# Patient Record
Sex: Female | Born: 1965
Health system: Southern US, Community
[De-identification: ages and names within clinical notes are randomized; demographics above are authoritative.]

## PROBLEM LIST (undated history)

## (undated) HISTORY — PX: DILATION AND CURETTAGE OF UTERUS: SHX78

---

## 2018-09-21 ENCOUNTER — Emergency Department (HOSPITAL_COMMUNITY)

## 2018-09-21 ENCOUNTER — Emergency Department (HOSPITAL_COMMUNITY)
Admission: EM | Admit: 2018-09-21 | Discharge: 2018-09-21 | Disposition: A | Discharge status: Home / Self Care | Attending: Emergency Medicine | Admitting: Emergency Medicine

## 2018-09-21 ENCOUNTER — Encounter (HOSPITAL_COMMUNITY): Payer: Self-pay

## 2018-09-21 ENCOUNTER — Other Ambulatory Visit: Payer: Self-pay

## 2018-09-21 DIAGNOSIS — R109 Unspecified abdominal pain: Secondary | ICD-10-CM | POA: Diagnosis present

## 2018-09-21 DIAGNOSIS — N200 Calculus of kidney: Secondary | ICD-10-CM | POA: Diagnosis not present

## 2018-09-21 DIAGNOSIS — R110 Nausea: Secondary | ICD-10-CM | POA: Diagnosis not present

## 2018-09-21 DIAGNOSIS — K5900 Constipation, unspecified: Secondary | ICD-10-CM | POA: Diagnosis not present

## 2018-09-21 DIAGNOSIS — Z79899 Other long term (current) drug therapy: Secondary | ICD-10-CM | POA: Insufficient documentation

## 2018-09-21 LAB — I-STAT BETA HCG BLOOD, ED (MC, WL, AP ONLY): I-stat hCG, quantitative: <5 m[IU]/mL (ref <5)

## 2018-09-21 LAB — URINALYSIS, ROUTINE W REFLEX MICROSCOPIC
Bilirubin Urine: NEGATIVE
Glucose, UA: NEGATIVE mg/dL
Hgb urine dipstick: NEGATIVE
Ketones, ur: NEGATIVE mg/dL
Leukocytes,Ua: NEGATIVE
Nitrite: NEGATIVE
Protein, ur: NEGATIVE mg/dL
Specific Gravity, Urine: 1.015 (ref 1.005–1.030)
pH: 6 (ref 5.0–8.0)

## 2018-09-21 LAB — CBC
HCT: 43.6 % (ref 36.0–46.0)
Hemoglobin: 13.8 g/dL (ref 12.0–15.0)
MCH: 30.2 pg (ref 26.0–34.0)
MCHC: 31.7 g/dL (ref 30.0–36.0)
MCV: 95.4 fL (ref 80.0–100.0)
Platelets: 146 10*3/uL — ABNORMAL LOW (ref 150–400)
RBC: 4.57 MIL/uL (ref 3.87–5.11)
RDW: 13.2 % (ref 11.5–15.5)
WBC: 18.5 10*3/uL — ABNORMAL HIGH (ref 4.0–10.5)
nRBC: 0 % (ref 0.0–0.2)

## 2018-09-21 LAB — BASIC METABOLIC PANEL
Anion gap: 10 (ref 5–15)
BUN: 13 mg/dL (ref 6–20)
CO2: 23 mmol/L (ref 22–32)
Calcium: 9.3 mg/dL (ref 8.9–10.3)
Chloride: 108 mmol/L (ref 98–111)
Creatinine, Ser: 0.95 mg/dL (ref 0.44–1.00)
GFR calc Af Amer: >60 mL/min (ref >60)
GFR calc non Af Amer: >60 mL/min (ref >60)
Glucose, Bld: 138 mg/dL — ABNORMAL HIGH (ref 70–99)
Potassium: 3.8 mmol/L (ref 3.5–5.1)
Sodium: 141 mmol/L (ref 135–145)

## 2018-09-21 MED ORDER — ONDANSETRON HCL 4 MG/2ML IJ SOLN
4.0000 mg | Freq: Once | INTRAMUSCULAR | Status: AC
  Administered 2018-09-21: 4 mg via INTRAVENOUS
  Filled 2018-09-21: qty 2

## 2018-09-21 MED ORDER — ONDANSETRON 4 MG PO TBDP
8.0000 mg | ORAL_TABLET | Freq: Once | ORAL | Status: AC
  Administered 2018-09-21: 8 mg via ORAL
  Filled 2018-09-21: qty 2

## 2018-09-21 MED ORDER — SODIUM CHLORIDE 0.9 % IV BOLUS
1000.0000 mL | Freq: Once | INTRAVENOUS | Status: AC
  Administered 2018-09-21: 1000 mL via INTRAVENOUS

## 2018-09-21 MED ORDER — KETOROLAC TROMETHAMINE 15 MG/ML IJ SOLN
15.0000 mg | Freq: Once | INTRAMUSCULAR | Status: AC
  Administered 2018-09-21: 15 mg via INTRAVENOUS
  Filled 2018-09-21: qty 1

## 2018-09-21 MED ORDER — HYDROMORPHONE HCL 2 MG PO TABS: 1.0000 mg | ORAL_TABLET | Freq: Three times a day (TID) | ORAL | 0 refills | Status: AC | PRN

## 2018-09-21 MED ORDER — SENNOSIDES-DOCUSATE SODIUM 8.6-50 MG PO TABS: 2.0000 | ORAL_TABLET | Freq: Every day | ORAL | 0 refills | Status: DC

## 2018-09-21 MED ORDER — SENNOSIDES-DOCUSATE SODIUM 8.6-50 MG PO TABS: 2.0000 | ORAL_TABLET | Freq: Every day | ORAL | 0 refills | Status: AC

## 2018-09-21 MED ORDER — ONDANSETRON 4 MG PO TBDP: 4.0000 mg | ORAL_TABLET | Freq: Three times a day (TID) | ORAL | 0 refills | Status: AC | PRN

## 2018-09-21 MED ORDER — HYDROMORPHONE HCL 1 MG/ML IJ SOLN
0.5000 mg | Freq: Once | INTRAMUSCULAR | Status: AC
  Administered 2018-09-21: 0.5 mg via INTRAVENOUS
  Filled 2018-09-21: qty 1

## 2018-09-21 MED ORDER — MORPHINE SULFATE (PF) 2 MG/ML IV SOLN
2.0000 mg | Freq: Once | INTRAVENOUS | Status: AC
  Administered 2018-09-21: 2 mg via INTRAVENOUS
  Filled 2018-09-21: qty 1

## 2018-09-21 MED FILL — HYDROmorphone HCL 2 MG TABS: 2 | 7 days supply | Qty: 10 | Fill #0

## 2018-09-21 MED FILL — ONDANSETRON ODT 4 MG TABLET: 4 | 7 days supply | Qty: 20 | Fill #0

## 2018-09-21 NOTE — ED Notes (Signed)
Patient verbalizes understanding of discharge instructions. Opportunity for questioning and answers were provided. Armband removed by staff, pt discharged from ED.  

## 2018-09-21 NOTE — ED Notes (Signed)
Nurse Navigator communication: Pts husband is picking the pt up from the lobby. RN notified.

## 2018-09-21 NOTE — ED Triage Notes (Signed)
Pt c/o R sided back and flank pain that began at 0630 this am; pain woke pt from sleep; denies urinary abnormality; endorses N/V/D; denies fever, cough, sob; denies sick contacts

## 2018-09-21 NOTE — ED Notes (Signed)
Patient transported to CT 

## 2018-09-21 NOTE — ED Provider Notes (Signed)
[facility] Provider Note   CSN: [id number] Arrival date & time: 09/21/18  [id number]    History   Chief Complaint Chief Complaint  Patient presents with   Flank Pain    HPI Lynn Vaughn is a 53 y.o. female.     HPI Patient presents with flank pain, nausea. Onset was about 3 hours ago, relatively sudden. Since onset symptoms been persistent with sharp severe pain in the right flank. No overt dysuria, no actual vomiting, though she is nauseous, uncomfortable, has not taken any medication for relief due to the nausea. No fever, no chills. When asked if she is healthy, patient initially responds yes, but then she notes that she has CLL. She is not on immunomodulatory agents.  History reviewed. No pertinent past medical history.  There are no active problems to display for this patient.   Past Surgical History:  Procedure Laterality Date   DILATION AND CURETTAGE OF UTERUS       OB History   No obstetric history on file.      Home Medications    Prior to Admission medications   Medication Sig Start Date End Date Taking? Authorizing Provider  Cyanocobalamin (VITAMIN B-12 PO) Take 1 tablet by mouth daily.   Yes [provider]  VITAMIN D PO Take 1 tablet by mouth daily.   Yes [provider]  HYDROmorphone (DILAUDID) 2 MG tablet Take 0.5 tablets (1 mg total) by mouth 3 (three) times daily as needed for severe pain. 09/21/18   Gerhard MunchLockwood, Magdalene Tardiff, MD  senna-docusate (SENOKOT-S) 8.6-50 MG tablet Take 2 tablets by mouth daily for 10 days. 09/21/18 10/01/18  Gerhard MunchLockwood, Tamotsu Wiederholt, MD    Family History No family history on file.  Social History Social History   Tobacco Use   Smoking status: Never Smoker   Smokeless tobacco: Never Used  Substance Use Topics   Alcohol use: Not Currently   Drug use: Never     Allergies   Patient has no known allergies.   Review of Systems Review of Systems  Constitutional:         Per HPI, otherwise negative  HENT:       Per HPI, otherwise negative  Respiratory:       Per HPI, otherwise negative  Cardiovascular:       Per HPI, otherwise negative  Gastrointestinal: Positive for abdominal pain and nausea. Negative for vomiting.  Endocrine:       Negative aside from HPI  Genitourinary:       Neg aside from HPI   Musculoskeletal:       Per HPI, otherwise negative  Skin: Negative.   Neurological: Negative for syncope.     Physical Exam Updated Vital Signs BP 108/71 (BP Location: Right Arm)    Pulse 65    Temp 97.6 F (36.4 C) (Oral)    Resp (!) 23    Ht 5\' 2"  (1.575 m)    Wt 72.6 kg    SpO2 100%    BMI 29.26 kg/m   Physical Exam Vitals signs and nursing note reviewed.  Constitutional:      General: She is not in acute distress.    Appearance: She is well-developed.     Comments: Uncomfortable appearing adult female awake and alert  HENT:     Head: Normocephalic and atraumatic.  Eyes:     Conjunctiva/sclera: Conjunctivae normal.  Cardiovascular:     Rate and Rhythm: Normal rate and regular rhythm.  Pulmonary:  Effort: Pulmonary effort is normal. No respiratory distress.     Breath sounds: Normal breath sounds. No stridor.  Abdominal:     General: There is no distension.     Tenderness: There is abdominal tenderness. There is right CVA tenderness and guarding.  Skin:    General: Skin is warm and dry.  Neurological:     Mental Status: She is alert and oriented to person, place, and time.     Cranial Nerves: No cranial nerve deficit.      ED Treatments / Results  Labs (all labs ordered are listed, but only abnormal results are displayed) Labs Reviewed  CBC - Abnormal; Notable for the following components:      Result Value   WBC 18.5 (*)    Platelets 146 (*)    All other components within normal limits  BASIC METABOLIC PANEL - Abnormal; Notable for the following components:   Glucose, Bld 138 (*)    All other components within  normal limits  URINALYSIS, ROUTINE W REFLEX MICROSCOPIC  I-STAT BETA HCG BLOOD, ED ([facility], [facility], [facility] ONLY)    EKG None  Radiology Ct Renal Stone Study  Result Date: 09/21/2018 CLINICAL DATA:  Right-sided flank pain EXAM: CT ABDOMEN AND PELVIS WITHOUT CONTRAST TECHNIQUE: Multidetector CT imaging of the abdomen and pelvis was performed following the standard protocol without IV contrast. COMPARISON:  None. FINDINGS: Lower chest: No acute abnormality. Hepatobiliary: No solid liver abnormality is seen. No gallstones, gallbladder wall thickening, or biliary dilatation. Pancreas: Unremarkable. No pancreatic ductal dilatation or surrounding inflammatory changes. Spleen: Normal in size without significant abnormality. Adrenals/Urinary Tract: Adrenal glands are unremarkable. There is a 2 mm calculus of the right greater signal junction mild associated right hydronephrosis and hydroureter. No evidence of additional urinary tract calculus. Fluid attenuation cyst the posterior midportion left kidney. Bladder is unremarkable. Stomach/Bowel: Stomach is within normal limits. Appendix appears normal. The proximal transverse colon is thickened and narrowed appearing (series 5, image 31), with a large burden of stool in the cecum and extensive fecalization of the distal small bowel. Vascular/Lymphatic: No significant vascular findings are present. No enlarged abdominal or pelvic lymph nodes. Reproductive: No mass or other significant abnormality. Other: No abdominal wall hernia or abnormality. No abdominopelvic ascites. Musculoskeletal: No acute or significant osseous findings. IMPRESSION: 1. There is a 2 mm calculus of the right greater signal junction mild associated right hydronephrosis and hydroureter. No evidence of additional urinary tract calculus. 2. The proximal transverse colon is thickened and narrowed appearing (series 5, image 31), with a large burden of stool in the cecum and extensive fecalization of the distal  small bowel. This unusual appearance suggests partial obstipation of the colon, and differential considerations primarily include stricture, which could be related to prior or ongoing inflammation. There is no obvious mass or lymphadenopathy. Correlate with clinical history and consider colonoscopy if not current. Electronically Signed   By: Lauralyn PrimesAlex  Bibbey M.D.   On: 09/21/2018 11:02    Procedures Procedures (including critical care time)  Medications Ordered in ED Medications  ketorolac (TORADOL) 15 MG/ML injection 15 mg (15 mg Intravenous Given 09/21/18 0952)  ondansetron (ZOFRAN) injection 4 mg (4 mg Intravenous Given 09/21/18 0950)  sodium chloride 0.9 % bolus 1,000 mL (1,000 mLs Intravenous New Bag/Given 09/21/18 0949)  morphine 2 MG/ML injection 2 mg (2 mg Intravenous Given 09/21/18 0954)  HYDROmorphone (DILAUDID) injection 0.5 mg (0.5 mg Intravenous Given 09/21/18 1029)     Initial Impression / Assessment and Plan /  ED Course  I have reviewed the triage vital signs and the nursing notes.  Pertinent labs & imaging results that were available during my care of the patient were reviewed by me and considered in my medical decision making (see chart for details).    Update: After initial morphine, patient continues to have severe pain.    12:34 PM Patient denies any ongoing complaints, we had a lengthy conversation about all findings including CT demonstrating kidney stone, without evidence of concurrent UTI.  On however, there is CT evidence for obstipation/constipation, the patient notes that she has had slowed bowel movements recently. Given her necessity for narcotic pain management, patient encouraged to stay well-hydrated, maximize fiber intake, and will start a stool softener regimen as well. Last colonoscopy was in February of this year, and the patient has follow-up with whom she will discuss her obstipation, constipation, as well as kidney stones as well as with nephrology. With  resolution of symptoms, patient discharged in stable condition.  Final Clinical Impressions(s) / ED Diagnoses   Final diagnoses:  Nephrolithiasis  Constipation, unspecified constipation type    ED Discharge Orders         Ordered    senna-docusate (SENOKOT-S) 8.6-50 MG tablet  Daily,   Status:  Discontinued     09/21/18 1231    HYDROmorphone (DILAUDID) 2 MG tablet  3 times daily PRN     09/21/18 1231    senna-docusate (SENOKOT-S) 8.6-50 MG tablet  Daily     09/21/18 1231           Carmin Muskrat, MD 09/21/18 1235

## 2018-09-21 NOTE — Discharge Instructions (Signed)
As discussed with your diagnosis of kidney stones and constipation is very important that you stay well-hydrated, monitor your condition carefully, and do not hesitate to return here for any concerning changes in your condition.  Otherwise, please be sure to follow-up with both your primary care physician and our urology colleagues.

## 2018-09-21 NOTE — ED Notes (Signed)
Urine specimen @ bedside. 

## 2019-12-20 IMAGING — CT CT RENAL STONE PROTOCOL
2 of 4 series · 16 of 46 positions shown, 18 images · non-contrast
Comparison: None.

CLINICAL DATA: Right-sided flank pain

EXAM:
CT ABDOMEN AND PELVIS WITHOUT CONTRAST
TECHNIQUE: Multidetector CT imaging of the abdomen and pelvis was performed
following the standard protocol without IV contrast.

[Series 3: renal stone 5.0 · axial · 0.89mm/px · z∈[+787,+1262]mm · 13 of 105 slices shown, 15 images]
[im 5/105  soft-tissue]
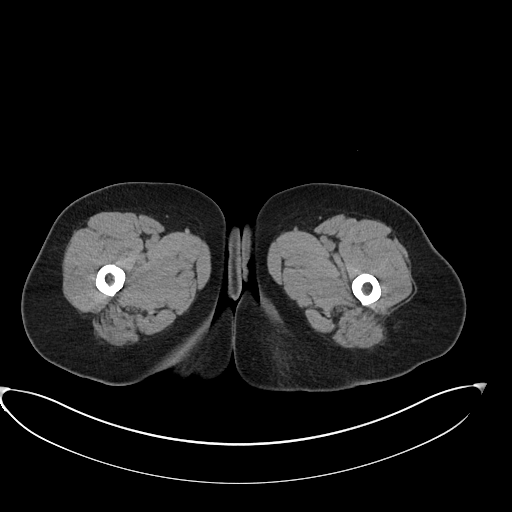
[im 5/105  bone]
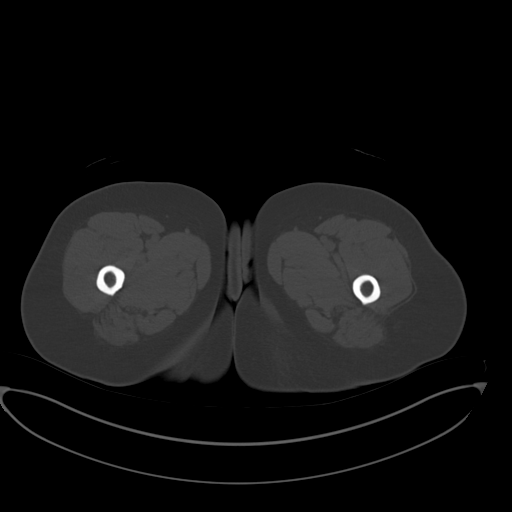
[im 14/105  soft-tissue]
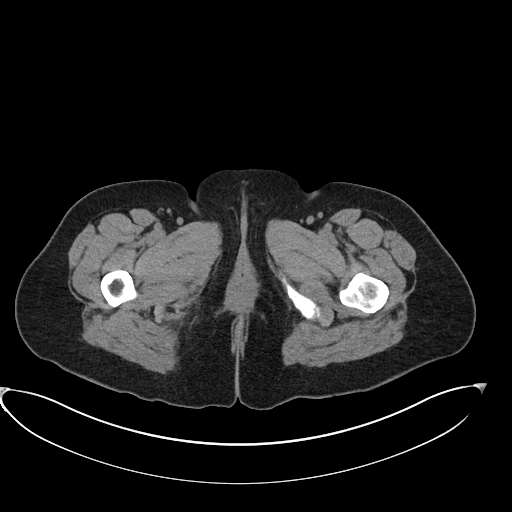
[im 22/105  soft-tissue]
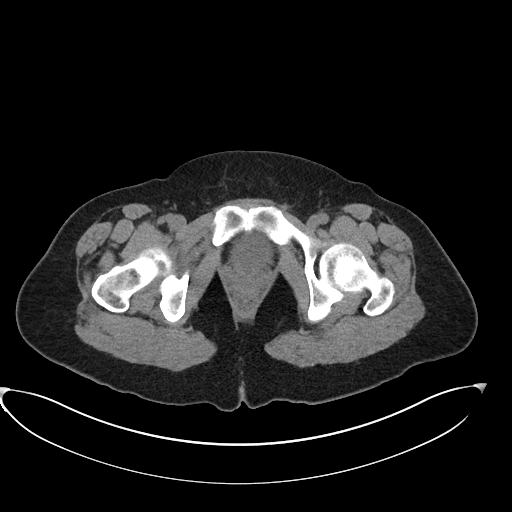
[im 31/105  soft-tissue]
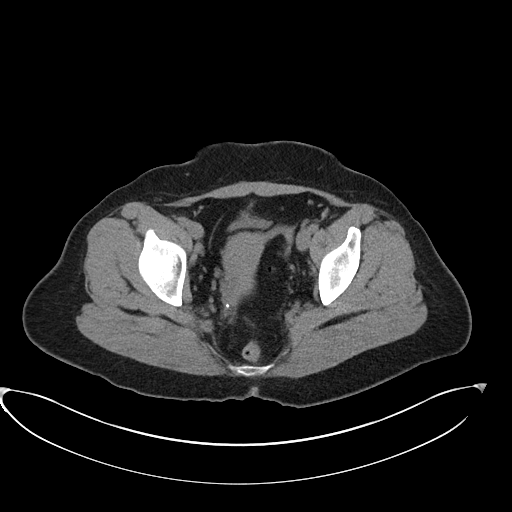
[im 35/105  soft-tissue]
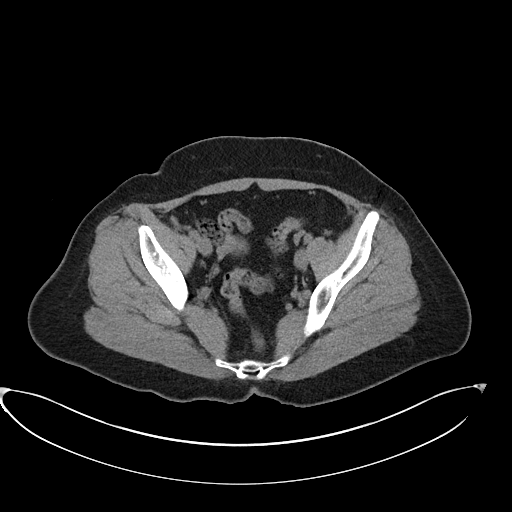
[im 44/105  soft-tissue]
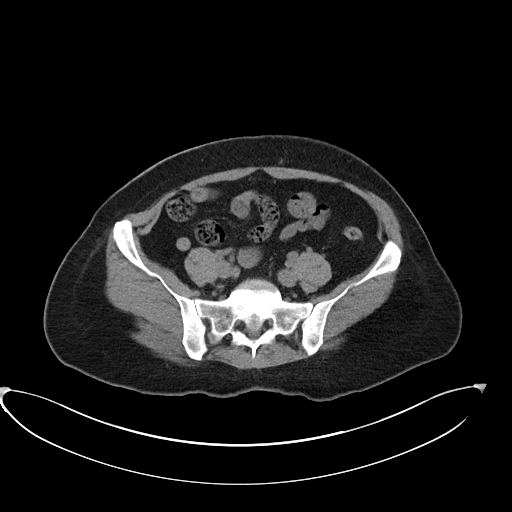
[im 53/105  soft-tissue]
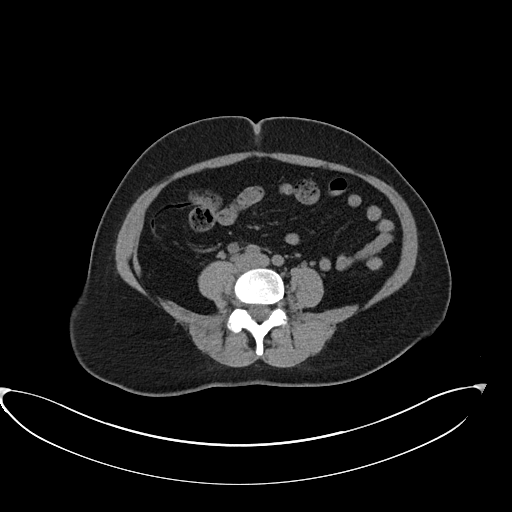
[im 61/105  soft-tissue]
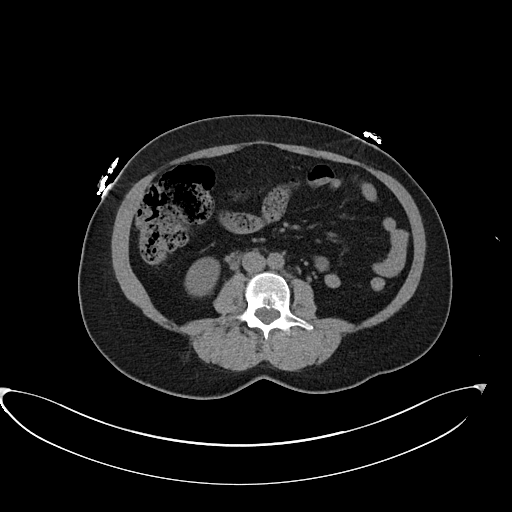
[im 70/105  soft-tissue]
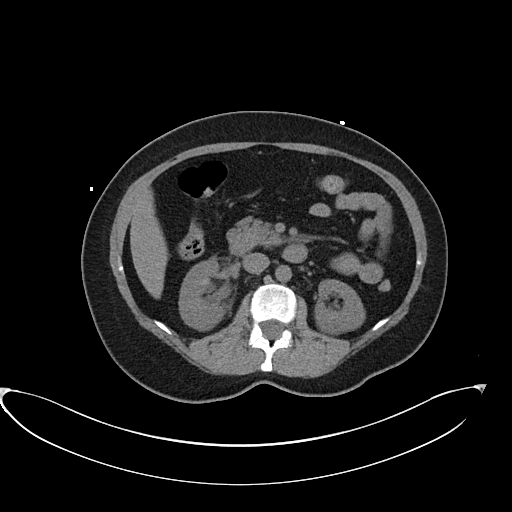
[im 70/105  bone]
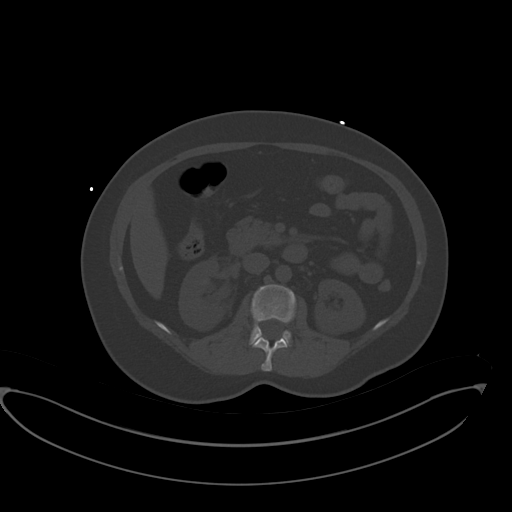
[im 74/105  soft-tissue]
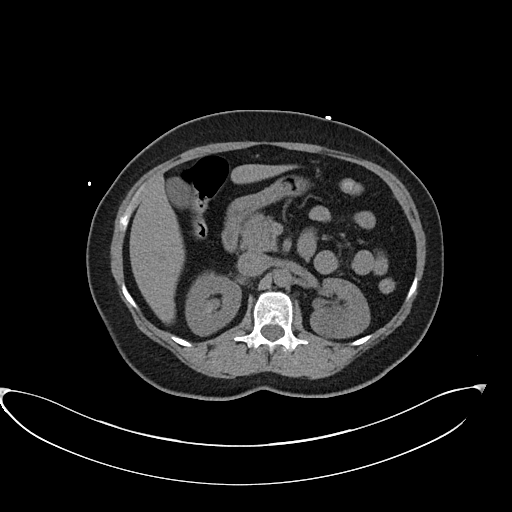
[im 83/105  soft-tissue]
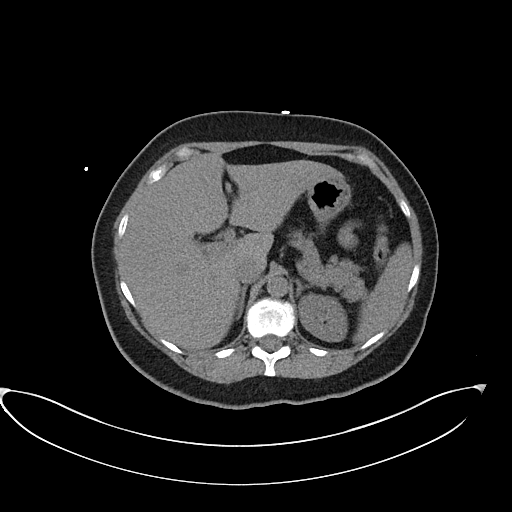
[im 92/105  soft-tissue]
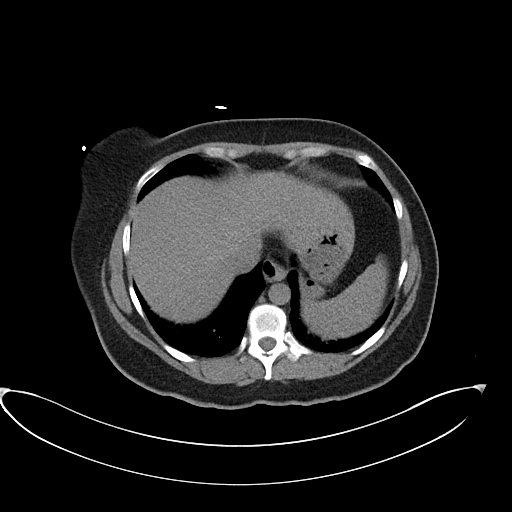
[im 100/105  soft-tissue]
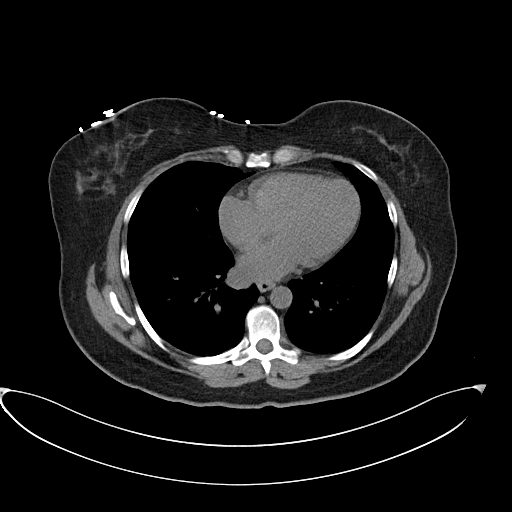

[Series 5: renal stone 3.0 cor · coronal · 0.87mm/px · 3 of 101 slices shown]
[im 34/101  soft-tissue]
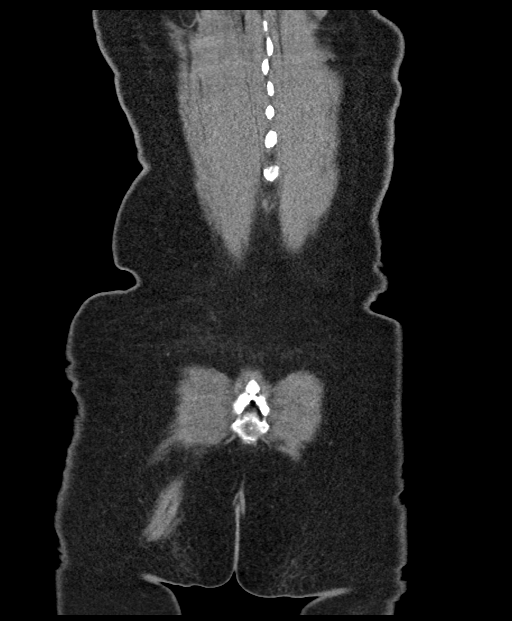
[im 45/101  soft-tissue]
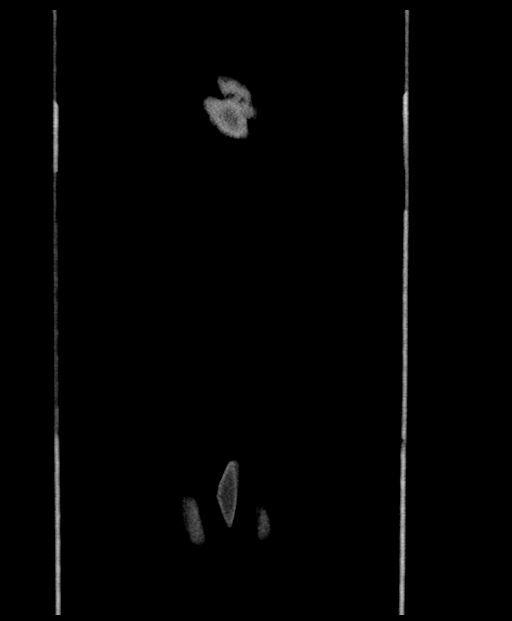
[im 56/101  soft-tissue]
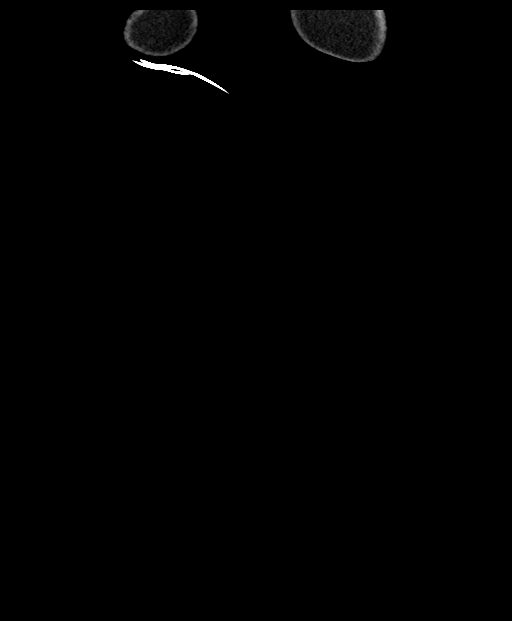

[16 of 46 positions shown; findings below may reference images not displayed]

FINDINGS: Lower chest: No acute abnormality.

Hepatobiliary: No solid liver abnormality is seen. No gallstones,
gallbladder wall thickening, or biliary dilatation.

Pancreas: Unremarkable. No pancreatic ductal dilatation or
surrounding inflammatory changes.

Spleen: Normal in size without significant abnormality.

Adrenals/Urinary Tract: Adrenal glands are unremarkable. There is a
2 mm calculus of the right greater signal junction mild associated
right hydronephrosis and hydroureter. No evidence of additional
urinary tract calculus. Fluid attenuation cyst the posterior
midportion left kidney. Bladder is unremarkable.

Stomach/Bowel: Stomach is within normal limits. Appendix appears
normal. The proximal transverse colon is thickened and narrowed
appearing (series 5, image 31), with a large burden of stool in the
cecum and extensive fecalization of the distal small bowel.

Vascular/Lymphatic: No significant vascular findings are present. No
enlarged abdominal or pelvic lymph nodes.

Reproductive: No mass or other significant abnormality.

Other: No abdominal wall hernia or abnormality. No abdominopelvic
ascites.

Musculoskeletal: No acute or significant osseous findings.
IMPRESSION: 1. There is a 2 mm calculus of the right greater signal junction
mild associated right hydronephrosis and hydroureter. No evidence of
additional urinary tract calculus.

2. The proximal transverse colon is thickened and narrowed appearing
(series 5, image 31), with a large burden of stool in the cecum and
extensive fecalization of the distal small bowel. This unusual
appearance suggests partial obstipation of the colon, and
differential considerations primarily include stricture, which could
be related to prior or ongoing inflammation. There is no obvious
mass or lymphadenopathy. Correlate with clinical history and
consider colonoscopy if not current.
